# Patient Record
Sex: Female | Born: 2000 | Hispanic: Yes | Marital: Single | Smoking: Never smoker
Health system: Southern US, Community
[De-identification: ages and names within clinical notes are randomized; demographics above are authoritative.]

---
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
## 2022-10-20 ENCOUNTER — Encounter: Payer: Self-pay | Admitting: Family Medicine

## 2022-10-20 ENCOUNTER — Ambulatory Visit: Payer: 59 | Admitting: Family Medicine

## 2022-10-20 VITALS — BP 98/64 | HR 88 | Temp 98.8°F | Ht 63.39 in | Wt 141.8 lb

## 2022-10-20 DIAGNOSIS — Z0000 Encounter for general adult medical examination without abnormal findings: Secondary | ICD-10-CM

## 2022-10-20 DIAGNOSIS — Z113 Encounter for screening for infections with a predominantly sexual mode of transmission: Secondary | ICD-10-CM

## 2022-10-20 DIAGNOSIS — Z23 Encounter for immunization: Secondary | ICD-10-CM | POA: Diagnosis not present

## 2022-10-20 DIAGNOSIS — N926 Irregular menstruation, unspecified: Secondary | ICD-10-CM

## 2022-10-20 DIAGNOSIS — Z7689 Persons encountering health services in other specified circumstances: Secondary | ICD-10-CM

## 2022-10-20 DIAGNOSIS — Z0001 Encounter for general adult medical examination with abnormal findings: Secondary | ICD-10-CM

## 2022-10-20 LAB — CBC WITH DIFFERENTIAL/PLATELET
Basophils Absolute: 0 10*3/uL (ref 0.0–0.1)
Basophils Relative: 0.8 % (ref 0.0–3.0)
Eosinophils Absolute: 0.1 10*3/uL (ref 0.0–0.7)
Eosinophils Relative: 2.2 % (ref 0.0–5.0)
HCT: 39.8 % (ref 36.0–46.0)
Hemoglobin: 13 g/dL (ref 12.0–15.0)
Lymphocytes Relative: 37.2 % (ref 12.0–46.0)
Lymphs Abs: 2.1 10*3/uL (ref 0.7–4.0)
MCHC: 32.6 g/dL (ref 30.0–36.0)
MCV: 92.4 fl (ref 78.0–100.0)
Monocytes Absolute: 0.5 10*3/uL (ref 0.1–1.0)
Monocytes Relative: 9.3 % (ref 3.0–12.0)
Neutro Abs: 2.8 10*3/uL (ref 1.4–7.7)
Neutrophils Relative %: 50.5 % (ref 43.0–77.0)
Platelets: 265 10*3/uL (ref 150.0–400.0)
RBC: 4.31 Mil/uL (ref 3.87–5.11)
RDW: 13 % (ref 11.5–15.5)
WBC: 5.5 10*3/uL (ref 4.0–10.5)

## 2022-10-20 LAB — COMPREHENSIVE METABOLIC PANEL
ALT: 13 U/L (ref 0–35)
AST: 19 U/L (ref 0–37)
Albumin: 4.3 g/dL (ref 3.5–5.2)
Alkaline Phosphatase: 66 U/L (ref 39–117)
BUN: 7 mg/dL (ref 6–23)
CO2: 28 mEq/L (ref 19–32)
Calcium: 9.5 mg/dL (ref 8.4–10.5)
Chloride: 103 mEq/L (ref 96–112)
Creatinine, Ser: 0.62 mg/dL (ref 0.40–1.20)
GFR: 126.88 mL/min (ref >60.00)
Glucose, Bld: 106 mg/dL — ABNORMAL HIGH (ref 70–99)
Potassium: 4.1 mEq/L (ref 3.5–5.1)
Sodium: 140 mEq/L (ref 135–145)
Total Bilirubin: 0.5 mg/dL (ref 0.2–1.2)
Total Protein: 7.4 g/dL (ref 6.0–8.3)

## 2022-10-20 LAB — LIPID PANEL
Cholesterol: 167 mg/dL (ref 0–200)
HDL: 44.7 mg/dL (ref >39.00)
LDL Cholesterol: 98 mg/dL (ref 0–99)
NonHDL: 122.32
Total CHOL/HDL Ratio: 4
Triglycerides: 121 mg/dL (ref 0.0–149.0)
VLDL: 24.2 mg/dL (ref 0.0–40.0)

## 2022-10-20 LAB — HEMOGLOBIN A1C: Hgb A1c MFr Bld: 5.4 % (ref 4.6–6.5)

## 2022-10-20 LAB — T4, FREE: Free T4: 0.82 ng/dL (ref 0.60–1.60)

## 2022-10-20 LAB — TSH: TSH: 0.97 u[IU]/mL (ref 0.35–5.50)

## 2022-10-20 NOTE — Patient Instructions (Signed)
It was nice meeting you this visit.  We are here if you need [place].  You can set up appointments by calling or online as needed.    We have ordered labs at this visit. It can take up to 1-2 weeks for results and processing. Most do not take that long.  If results require follow up or explanation, we will call you with instructions. Clinically stable results will be released to your [facility] or sent to you via mail.

## 2022-10-20 NOTE — Progress Notes (Signed)
Established Patient Office Visit   Subjective  Patient ID: Christina Liu, female    DOB: Aug 08, 2000  Age: 22 y.o. MRN: [id number]  Chief Complaint  Patient presents with   New Patient (Initial Visit)  Pt is accompanied by her boyfriend, Jonathon.  Patient interviewed with and without BF present.  Patient is a 22 year old female seen for CPE and establish care.  Patient states she was last seen by her pediatrician in [facility].  Patient does not recall the name of provider or practice.  Pt denies any chronic health problems and is not currently on meds.  Pt notes irregular menses in the last few months which may have been due to frequent plan b use.  Previously menses were q 28 days, but now may occur 2 wks late.  Menses last 7-8 days.  Denies heavy bleeding.  Notes occasional condom use with current BF.    Patient mentions wanting to lose weight  and having difficulty.  Would like weight to be in the 130s.  States tried to exercise but has not been consistent.  Patient endorses eating out frequently.  Plans to work on diet changes.  Allergies: NKDA  Past surg hx: wisdom teeth extraction  Social Hx: Pt is single.  She has a BF.  Pt has an associates degree and currently works as a Nurse, learning disability.  Pt endorses social EtOH use.  Pt denies tobacco and drug use.  Health maintenance: Patient has never had a Pap.  Family medical history: Dad-HTN PGM-HTN, DM2, CAD s/p CABG.    History reviewed. No pertinent past medical history. History reviewed. No pertinent surgical history. Social History   Tobacco Use   Smoking status: Never    Passive exposure: Never   Smokeless tobacco: Never  Substance Use Topics   Alcohol use: Yes    Comment: once a month   Drug use: Never   History reviewed. No pertinent family history. Not on File    ROS Negative unless stated above    Objective:     BP 98/64 (BP Location: Right Arm, Patient Position: Sitting, Cuff Size: Normal)   Pulse 88    Temp 98.8 F (37.1 C) (Oral)   Ht 5' 3.39" (1.61 m)   Wt 141 lb 12.8 oz (64.3 kg)   LMP 10/17/2022 (Exact Date)   SpO2 97%   BMI 24.81 kg/m    Physical Exam Constitutional:      Appearance: Normal appearance.  HENT:     Head: Normocephalic and atraumatic.     Right Ear: Tympanic membrane, ear canal and external ear normal.     Left Ear: Tympanic membrane, ear canal and external ear normal.     Nose: Nose normal.     Mouth/Throat:     Mouth: Mucous membranes are moist.     Pharynx: No oropharyngeal exudate or posterior oropharyngeal erythema.  Eyes:     General: No scleral icterus.    Extraocular Movements: Extraocular movements intact.     Conjunctiva/sclera: Conjunctivae normal.     Pupils: Pupils are equal, round, and reactive to light.  Neck:     Thyroid: No thyromegaly.  Cardiovascular:     Rate and Rhythm: Normal rate and regular rhythm.     Pulses: Normal pulses.     Heart sounds: Normal heart sounds. No murmur heard.    No friction rub.  Pulmonary:     Effort: Pulmonary effort is normal.     Breath sounds: Normal breath sounds. No wheezing,  rhonchi or rales.  Abdominal:     General: Bowel sounds are normal.     Palpations: Abdomen is soft.     Tenderness: There is no abdominal tenderness.  Musculoskeletal:        General: No deformity. Normal range of motion.  Lymphadenopathy:     Cervical: No cervical adenopathy.  Skin:    General: Skin is warm and dry.     Findings: No lesion.  Neurological:     General: No focal deficit present.     Mental Status: She is alert and oriented to person, place, and time.  Psychiatric:        Mood and Affect: Mood normal.        Thought Content: Thought content normal.       10/20/2022   12:35 PM  Depression screen PHQ 2/9  Decreased Interest 0  Down, Depressed, Hopeless 0  PHQ - 2 Score 0  Altered sleeping 0  Tired, decreased energy 1  Change in appetite 1  Feeling bad or failure about yourself  0  Trouble  concentrating 0  Moving slowly or fidgety/restless 0  Suicidal thoughts 0  PHQ-9 Score 2  Difficult doing work/chores Not difficult at all      10/20/2022   12:34 PM  GAD 7 : Generalized Anxiety Score  Nervous, Anxious, on Edge 0  Control/stop worrying 0  Worry too much - different things 0  Trouble relaxing 0  Restless 0  Easily annoyed or irritable 0  Afraid - awful might happen 0  Total GAD 7 Score 0  Anxiety Difficulty Not difficult at all      No results found for any visits on 10/20/22.    Assessment & Plan:  Encounter for routine adult health examination with abnormal findings -     Lipid panel -     Hemoglobin A1c -     Comprehensive metabolic panel  Irregular menses -     CBC with Differential/Platelet -     TSH -     T4, free -     Hemoglobin A1c  Routine screening for STI (sexually transmitted infection) -     C. trachomatis/N. gonorrhoeae RNA -     HIV Antibody (routine testing w rflx) -     RPR  Need for influenza vaccination -     Flu vaccine trivalent PF, 6mos and older(Flulaval,Afluria,Fluarix,Fluzone)  Encounter to establish care  Patient seen for establish care and CPE.  Age-appropriate health screenings discussed including Pap.  As pt currently on menses wishes to wait on Pap.  Will obtain labs.  Immunizations reviewed.  Given influenza vaccine this visit.  Consider HPV vaccine.  Discussed lifestyle modifications.  Current BMI 24.81.  Will obtain records from previous provider.  Change in hormones 2/2 Plan B use likely contributing to irregular menses.  Will evaluate for thyroid dysfunction.  Continue to monitor.  Return if symptoms worsen or fail to improve.   Deeann Saint, MD

## 2022-10-22 LAB — HIV ANTIBODY (ROUTINE TESTING W REFLEX): HIV 1&2 Ab, 4th Generation: NONREACTIVE

## 2022-10-22 LAB — C. TRACHOMATIS/N. GONORRHOEAE RNA
C. trachomatis RNA, TMA: NOT DETECTED
N. gonorrhoeae RNA, TMA: NOT DETECTED

## 2022-10-22 LAB — RPR: RPR Ser Ql: NONREACTIVE

## 2022-12-22 DIAGNOSIS — H5213 Myopia, bilateral: Secondary | ICD-10-CM | POA: Diagnosis not present

## 2022-12-22 DIAGNOSIS — H52223 Regular astigmatism, bilateral: Secondary | ICD-10-CM | POA: Diagnosis not present

## 2023-10-12 ENCOUNTER — Encounter: Payer: Self-pay | Admitting: Family Medicine

## 2023-10-12 ENCOUNTER — Ambulatory Visit (INDEPENDENT_AMBULATORY_CARE_PROVIDER_SITE_OTHER): Payer: Self-pay | Admitting: Family Medicine

## 2023-10-12 ENCOUNTER — Other Ambulatory Visit (HOSPITAL_COMMUNITY)
Admission: RE | Admit: 2023-10-12 | Discharge: 2023-10-12 | Disposition: A | Discharge status: Home / Self Care | Source: Ambulatory Visit | Attending: Family Medicine | Admitting: Family Medicine

## 2023-10-12 VITALS — BP 98/60 | HR 62 | Temp 98.8°F | Ht 63.9 in | Wt 120.8 lb

## 2023-10-12 DIAGNOSIS — Z124 Encounter for screening for malignant neoplasm of cervix: Secondary | ICD-10-CM | POA: Insufficient documentation

## 2023-10-12 DIAGNOSIS — Z23 Encounter for immunization: Secondary | ICD-10-CM | POA: Diagnosis not present

## 2023-10-12 DIAGNOSIS — Z0000 Encounter for general adult medical examination without abnormal findings: Secondary | ICD-10-CM

## 2023-10-12 LAB — COMPREHENSIVE METABOLIC PANEL WITH GFR
ALT: 14 U/L (ref 0–35)
AST: 18 U/L (ref 0–37)
Albumin: 4.5 g/dL (ref 3.5–5.2)
Alkaline Phosphatase: 51 U/L (ref 39–117)
BUN: 10 mg/dL (ref 6–23)
CO2: 30 meq/L (ref 19–32)
Calcium: 9.5 mg/dL (ref 8.4–10.5)
Chloride: 103 meq/L (ref 96–112)
Creatinine, Ser: 0.6 mg/dL (ref 0.40–1.20)
GFR: 127.01 mL/min (ref >60.00)
Glucose, Bld: 89 mg/dL (ref 70–99)
Potassium: 4.5 meq/L (ref 3.5–5.1)
Sodium: 140 meq/L (ref 135–145)
Total Bilirubin: 0.6 mg/dL (ref 0.2–1.2)
Total Protein: 7.2 g/dL (ref 6.0–8.3)

## 2023-10-12 LAB — LIPID PANEL
Cholesterol: 171 mg/dL (ref 0–200)
HDL: 56.1 mg/dL (ref >39.00)
LDL Cholesterol: 99 mg/dL (ref 0–99)
NonHDL: 114.64
Total CHOL/HDL Ratio: 3
Triglycerides: 79 mg/dL (ref 0.0–149.0)
VLDL: 15.8 mg/dL (ref 0.0–40.0)

## 2023-10-12 LAB — CBC WITH DIFFERENTIAL/PLATELET
Basophils Absolute: 0 K/uL (ref 0.0–0.1)
Basophils Relative: 0.9 % (ref 0.0–3.0)
Eosinophils Absolute: 0.1 K/uL (ref 0.0–0.7)
Eosinophils Relative: 1.6 % (ref 0.0–5.0)
HCT: 39.8 % (ref 36.0–46.0)
Hemoglobin: 13.2 g/dL (ref 12.0–15.0)
Lymphocytes Relative: 46 % (ref 12.0–46.0)
Lymphs Abs: 2.4 K/uL (ref 0.7–4.0)
MCHC: 33.2 g/dL (ref 30.0–36.0)
MCV: 90.5 fl (ref 78.0–100.0)
Monocytes Absolute: 0.5 K/uL (ref 0.1–1.0)
Monocytes Relative: 9.1 % (ref 3.0–12.0)
Neutro Abs: 2.2 K/uL (ref 1.4–7.7)
Neutrophils Relative %: 42.4 % — ABNORMAL LOW (ref 43.0–77.0)
Platelets: 237 K/uL (ref 150.0–400.0)
RBC: 4.39 Mil/uL (ref 3.87–5.11)
RDW: 13.2 % (ref 11.5–15.5)
WBC: 5.1 K/uL (ref 4.0–10.5)

## 2023-10-12 LAB — TSH: TSH: 2.04 u[IU]/mL (ref 0.35–5.50)

## 2023-10-12 LAB — T4, FREE: Free T4: 0.78 ng/dL (ref 0.60–1.60)

## 2023-10-12 LAB — HEMOGLOBIN A1C: Hgb A1c MFr Bld: 5.6 % (ref 4.6–6.5)

## 2023-10-12 NOTE — Progress Notes (Signed)
 Established Patient Office Visit   Subjective  Patient ID: Christina Liu, female    DOB: [birth year]  Age: 23 y.o. MRN: [id number]  Chief Complaint  Patient presents with   Annual Exam    And Pap    Pt is a 23 yo female seen for CPE and pap.  Pt states she is doing well.  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  Pt is without complaint.    There are no active problems to display for this patient.  History reviewed. No pertinent past medical history. History reviewed. No pertinent surgical history. Social History   Tobacco Use   Smoking status: Never    Passive exposure: Never   Smokeless tobacco: Never  Substance Use Topics   Alcohol use: Not Currently    Comment: once a month   Drug use: Never   History reviewed. No pertinent family history. Not on File  ROS Negative unless stated above    Objective:     BP 98/60 (BP Location: Left Arm, Patient Position: Sitting, Cuff Size: Normal)   Pulse 62   Temp 98.8 F (37.1 C) (Oral)   Ht 5' 3.9 (1.623 m)   Wt 120 lb 12.8 oz (54.8 kg)   LMP 10/02/2023 (Approximate)   SpO2 97%   BMI 20.80 kg/m  BP Readings from Last 3 Encounters:  10/12/23 98/60  10/20/22 98/64   Wt Readings from Last 3 Encounters:  10/12/23 120 lb 12.8 oz (54.8 kg)  10/20/22 141 lb 12.8 oz (64.3 kg)      Physical Exam Constitutional:      Appearance: Normal appearance.  HENT:     Head: Normocephalic and atraumatic.     Right Ear: Tympanic membrane, ear canal and external ear normal.     Left Ear: Tympanic membrane, ear canal and external ear normal.     Nose: Nose normal.     Mouth/Throat:     Mouth: Mucous membranes are moist.     Pharynx: No oropharyngeal exudate or posterior oropharyngeal erythema.  Eyes:     General: No scleral icterus.    Extraocular Movements: Extraocular movements intact.     Conjunctiva/sclera: Conjunctivae normal.     Pupils: Pupils are equal, round, and reactive to light.  Neck:     Thyroid : No  thyromegaly.  Cardiovascular:     Rate and Rhythm: Normal rate and regular rhythm.     Pulses: Normal pulses.     Heart sounds: Normal heart sounds. No murmur heard.    No friction rub.  Pulmonary:     Effort: Pulmonary effort is normal.     Breath sounds: Normal breath sounds. No wheezing, rhonchi or rales.  Abdominal:     General: Bowel sounds are normal.     Palpations: Abdomen is soft.     Tenderness: There is no abdominal tenderness.  Genitourinary:    General: Normal vulva.     Pubic Area: No rash.      Labia:        Right: No lesion.        Left: No lesion.      Urethra: No urethral swelling.     Vagina: Normal. No erythema or bleeding.     Cervix: Discharge and lesion present.     Uterus: Normal.      Rectum: Normal.  Musculoskeletal:        General: No deformity. Normal range of motion.  Lymphadenopathy:     Cervical: No  cervical adenopathy.  Skin:    General: Skin is warm and dry.     Findings: No lesion.  Neurological:     General: No focal deficit present.     Mental Status: She is alert and oriented to person, place, and time.  Psychiatric:        Mood and Affect: Mood normal.        Thought Content: Thought content normal.        10/12/2023   10:46 AM 10/20/2022   12:35 PM  Depression screen PHQ 2/9  Decreased Interest 0 0  Down, Depressed, Hopeless 0 0  PHQ - 2 Score 0 0  Altered sleeping 0 0  Tired, decreased energy 0 1  Change in appetite 0 1  Feeling bad or failure about yourself  0 0  Trouble concentrating 0 0  Moving slowly or fidgety/restless 0 0  Suicidal thoughts 0 0  PHQ-9 Score 0 2  Difficult doing work/chores  Not difficult at all      10/12/2023   10:46 AM 10/20/2022   12:34 PM  GAD 7 : Generalized Anxiety Score  Nervous, Anxious, on Edge 0 0  Control/stop worrying 0 0  Worry too much - different things 0 0  Trouble relaxing 0 0  Restless 0 0  Easily annoyed or irritable 0 0  Afraid - awful might happen 0 0  Total GAD 7  Score 0 0  Anxiety Difficulty  Not difficult at all     No results found for any visits on 10/12/23.    Assessment & Plan:   Well adult exam -     CBC with Differential/Platelet; Future -     Comprehensive metabolic panel with GFR; Future -     Hemoglobin A1c; Future -     Lipid panel; Future -     T4, free; Future -     TSH; Future  Need for meningococcus vaccine -     Meningococcal B, OMV  Need for HPV vaccine -     HPV 9-valent vaccine,Recombinat  Cervical cancer screening -     Cytology - PAP  Age appropriate health screenings discussed.  Obtain labs.  Pap done this visit.  Immunizations reviewed.  HPV vaccine dose 1 and Bexsero given.    F/u in 2 months for HPV dose 2.  Influenza vaccine when available.     Return in about 1 year (around 10/11/2024) for physical.  sooner if needed.SABRA Clotilda JONELLE Mercer, MD

## 2023-10-14 ENCOUNTER — Ambulatory Visit: Payer: Self-pay | Admitting: Family Medicine

## 2023-10-14 LAB — CYTOLOGY - PAP
Chlamydia: NEGATIVE
Comment: NEGATIVE
Comment: NEGATIVE
Comment: NORMAL
Diagnosis: NEGATIVE
Neisseria Gonorrhea: NEGATIVE
Trichomonas: NEGATIVE
